# Patient Record
Sex: Male | Born: 1995 | Hispanic: Yes | Marital: Single | Smoking: Never smoker
Health system: Southern US, Community
[De-identification: ages and names within clinical notes are randomized; demographics above are authoritative.]

---

## 2020-12-18 ENCOUNTER — Other Ambulatory Visit (HOSPITAL_COMMUNITY): Payer: Self-pay

## 2021-01-05 ENCOUNTER — Encounter: Payer: Self-pay | Admitting: Nurse Practitioner

## 2021-01-05 ENCOUNTER — Ambulatory Visit: Payer: BLUE CROSS/BLUE SHIELD | Admitting: Nurse Practitioner

## 2021-01-05 ENCOUNTER — Other Ambulatory Visit: Payer: Self-pay

## 2021-01-05 VITALS — BP 118/78 | HR 99 | Temp 98.4°F | Ht 64.6 in | Wt 151.6 lb

## 2021-01-05 DIAGNOSIS — F1291 Cannabis use, unspecified, in remission: Secondary | ICD-10-CM

## 2021-01-05 DIAGNOSIS — Z87898 Personal history of other specified conditions: Secondary | ICD-10-CM | POA: Diagnosis not present

## 2021-01-05 DIAGNOSIS — Z7689 Persons encountering health services in other specified circumstances: Secondary | ICD-10-CM

## 2021-01-05 NOTE — Patient Instructions (Signed)
Managing the Challenge of Quitting Smoking Quitting smoking is a physical and mental challenge. You will face cravings, withdrawal symptoms, and temptation. Before quitting, work with your health care provider to make a plan that can help you manage quitting. Preparation can help you quit and keep you from giving in. How to manage lifestyle changes Managing stress Stress can make you want to smoke, and wanting to smoke may cause stress. It is important to find ways to manage your stress. You might try some of the following:  Practice relaxation techniques. ? Breathe slowly and deeply, in through your nose and out through your mouth. ? Listen to music. ? Soak in a bath or take a shower. ? Imagine a peaceful place or vacation.  Get some support. ? Talk with family or friends about your stress. ? Join a support group. ? Talk with a counselor or therapist.  Get some physical activity. ? Go for a walk, run, or bike ride. ? Play a favorite sport. ? Practice yoga.   Medicines Talk with your health care provider about medicines that might help you deal with cravings and make quitting easier for you. Relationships Social situations can be difficult when you are quitting smoking. To manage this, you can:  Avoid parties and other social situations where people might be smoking.  Avoid alcohol.  Leave right away if you have the urge to smoke.  Explain to your family and friends that you are quitting smoking. Ask for support and let them know you might be a bit grumpy.  Plan activities where smoking is not an option. General instructions Be aware that many people gain weight after they quit smoking. However, not everyone does. To keep from gaining weight, have a plan in place before you quit and stick to the plan after you quit. Your plan should include:  Having healthy snacks. When you have a craving, it may help to: ? Eat popcorn, carrots, celery, or other cut vegetables. ? Chew  sugar-free gum.  Changing how you eat. ? Eat small portion sizes at meals. ? Eat 4-6 small meals throughout the day instead of 1-2 large meals a day. ? Be mindful when you eat. Do not watch television or do other things that might distract you as you eat.  Exercising regularly. ? Make time to exercise each day. If you do not have time for a long workout, do short bouts of exercise for 5-10 minutes several times a day. ? Do some form of strengthening exercise, such as weight lifting. ? Do some exercise that gets your heart beating and causes you to breathe deeply, such as walking fast, running, swimming, or biking. This is very important.  Drinking plenty of water or other low-calorie or no-calorie drinks. Drink 6-8 glasses of water daily.   How to recognize withdrawal symptoms Your body and mind may experience discomfort as you try to get used to not having nicotine in your system. These effects are called withdrawal symptoms. They may include:  Feeling hungrier than normal.  Having trouble concentrating.  Feeling irritable or restless.  Having trouble sleeping.  Feeling depressed.  Craving a cigarette. To manage withdrawal symptoms:  Avoid places, people, and activities that trigger your cravings.  Remember why you want to quit.  Get plenty of sleep.  Avoid coffee and other caffeinated drinks. These may worsen some of your symptoms. These symptoms may surprise you. But be assured that they are normal to have when quitting smoking. How to manage cravings   Come up with a plan for how to deal with your cravings. The plan should include the following:  A definition of the specific situation you want to deal with.  An alternative action you will take.  A clear idea for how this action will help.  The name of someone who might help you with this. Cravings usually last for 5-10 minutes. Consider taking the following actions to help you with your plan to deal with  cravings:  Keep your mouth busy. ? Chew sugar-free gum. ? Suck on hard candies or a straw. ? Brush your teeth.  Keep your hands and body busy. ? Change to a different activity right away. ? Squeeze or play with a ball. ? Do an activity or a hobby, such as making bead jewelry, practicing needlepoint, or working with wood. ? Mix up your normal routine. ? Take a short exercise break. Go for a quick walk or run up and down stairs.  Focus on doing something kind or helpful for someone else.  Call a friend or family member to talk during a craving.  Join a support group.  Contact a quitline. Where to find support To get help or find a support group:  Call the National Cancer Institute's Smoking Quitline: 1-800-QUIT NOW (784-8669)  Visit the website of the Substance Abuse and Mental Health Services Administration: www.samhsa.gov  Text QUIT to SmokefreeTXT: 478848 Where to find more information Visit these websites to find more information on quitting smoking:  National Cancer Institute: www.smokefree.gov  American Lung Association: www.lung.org  American Cancer Society: www.cancer.org  Centers for Disease Control and Prevention: www.cdc.gov  American Heart Association: www.heart.org Contact a health care provider if:  You want to change your plan for quitting.  The medicines you are taking are not helping.  Your eating feels out of control or you cannot sleep. Get help right away if:  You feel depressed or become very anxious. Summary  Quitting smoking is a physical and mental challenge. You will face cravings, withdrawal symptoms, and temptation to smoke again. Preparation can help you as you go through these challenges.  Try different techniques to manage stress, handle social situations, and prevent weight gain.  You can deal with cravings by keeping your mouth busy (such as by chewing gum), keeping your hands and body busy, calling family or friends, or  contacting a quitline for people who want to quit smoking.  You can deal with withdrawal symptoms by avoiding places where people smoke, getting plenty of rest, and avoiding drinks with caffeine. This information is not intended to replace advice given to you by your health care provider. Make sure you discuss any questions you have with your health care provider. Document Revised: 05/28/2019 Document Reviewed: 05/28/2019 Elsevier Patient Education  2021 Elsevier Inc.  

## 2021-01-05 NOTE — Progress Notes (Signed)
I,Tianna Badgett,acting as a Neurosurgeon for [employer], NP.,have documented all relevant documentation on the behalf of [employer], NP,as directed by  Charlesetta Ivory, NP while in the presence of Charlesetta Ivory, NP.  This visit occurred during the SARS-CoV-2 public health emergency.  Safety protocols were in place, including screening questions prior to the visit, additional usage of staff PPE, and extensive cleaning of exam room while observing appropriate contact time as indicated for disinfecting solutions.  Subjective:     Patient ID: Hector Hunt , male    DOB: 1996/01/18 , 25 y.o.   MRN: [id number]   Chief Complaint  Patient presents with  . Establish Care    HPI  Patient is here to establish care. He has not been followed by PCP since pediatrics. He started smoking when he was 25 years old. And he quit last month in April of 2022.  He has been doing it for many years but states this time he is for sure. He smoked less than a gram a day. No other drugs. He only smoked marajuana.  Alcohol: Yes. Twice a week. He drink beer and wine. He drinks less than 3 beers. He is concerned about his lungs. He does not have any shortness of breath, no pain, no pressure. He feels like his lungs are inflamed. He feels pressure and bruised on his lungs.  Diet: Does not eat a lot of fast food. Chicken and [place] mostly, corn, broccoli, cucumbers, brown rice. Water Exercise: 5 x week, skateboard.  Not in school. 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     No past medical history on file.   Family History  Problem Relation Age of Onset  . Healthy Mother   . Healthy Father   . HIV Brother     No current outpatient medications on file.   No Known Allergies   Review of Systems  Constitutional: Negative.  Negative for chills, fatigue and fever.  HENT: Negative for sinus pain and sneezing.   Respiratory: Negative.  Negative for cough, chest tightness, shortness of  breath and wheezing.   Cardiovascular: Negative.  Negative for chest pain and palpitations.  Gastrointestinal: Negative.  Negative for diarrhea and nausea.  Neurological: Negative.      Today's Vitals   01/05/21 1444  BP: 118/78  Pulse: 99  Temp: 98.4 F (36.9 C)  TempSrc: Oral  Weight: 151 lb 9.6 oz (68.8 kg)  Height: 5' 4.6" (1.641 m)   Body mass index is 25.54 kg/m.   Objective:  Physical Exam Constitutional:      Appearance: Normal appearance.  HENT:     Head: Normocephalic and atraumatic.  Cardiovascular:     Rate and Rhythm: Normal rate and regular rhythm.     Pulses: Normal pulses.     Heart sounds: Normal heart sounds.  Pulmonary:     Effort: Pulmonary effort is normal. No respiratory distress.     Breath sounds: Normal breath sounds. No wheezing.  Chest:     Chest wall: No tenderness.  Skin:    Capillary Refill: Capillary refill takes less than 2 seconds.  Neurological:     Mental Status: He is alert.  Psychiatric:        Mood and Affect: Mood normal.        Behavior: Behavior normal.         Assessment And Plan:     1. Establishing care with new doctor, encounter for -Patient is here to establish care. Hector Hunt  over patient medical, family, social and surgical history. -Reviewed with patient their medications and any allergies  -Reviewed with patient their sexual orientation, drug/tobacco and alcohol use -Dicussed any new concerns with patient  -recommended patient comes in for a physical exam and complete blood work.  -Educated patient about the importance of annual screenings and immunizations.  -Advised patient to eat a healthy diet along with exercise for atleast 30-45 min atleast 4-5 days of the week.   2. History of marijuana use -will get a chest xray due to history of marijuana use since he was 25 years old. He is concerned about his lungs and health. Lungs sounds were clear upon auscultation.  - DG Chest 2 View; Future   Follow up: 1 month  for annual physical exam   The patient was encouraged to call or send a message through MyChart for any questions or concerns.   Patient was given opportunity to ask questions. Patient verbalized understanding of the plan and was able to repeat key elements of the plan. All questions were answered to their satisfaction.  Raman Bonner Larue, DNP   I, Raman Tyrick Dunagan have reviewed all documentation for this visit. The documentation on 01/05/21 for the exam, diagnosis, procedures, and orders are all accurate and complete.    IF YOU HAVE BEEN REFERRED TO A SPECIALIST, IT MAY TAKE 1-2 WEEKS TO SCHEDULE/PROCESS THE REFERRAL. IF YOU HAVE NOT HEARD FROM US/SPECIALIST IN TWO WEEKS, PLEASE GIVE [place] A CALL AT [phone] X [phone].   THE PATIENT IS ENCOURAGED TO PRACTICE SOCIAL DISTANCING DUE TO THE COVID-19 PANDEMIC.

## 2021-02-03 ENCOUNTER — Other Ambulatory Visit (HOSPITAL_COMMUNITY)
Admission: RE | Admit: 2021-02-03 | Discharge: 2021-02-03 | Disposition: A | Discharge status: Home / Self Care | Payer: BLUE CROSS/BLUE SHIELD | Source: Ambulatory Visit | Attending: Nurse Practitioner | Admitting: Nurse Practitioner

## 2021-02-03 ENCOUNTER — Ambulatory Visit
Admission: RE | Admit: 2021-02-03 | Discharge: 2021-02-03 | Disposition: A | Discharge status: Home / Self Care | Payer: Self-pay | Source: Ambulatory Visit | Attending: Nurse Practitioner | Admitting: Nurse Practitioner

## 2021-02-03 ENCOUNTER — Encounter: Payer: Self-pay | Admitting: Nurse Practitioner

## 2021-02-03 ENCOUNTER — Ambulatory Visit (INDEPENDENT_AMBULATORY_CARE_PROVIDER_SITE_OTHER): Payer: BLUE CROSS/BLUE SHIELD | Admitting: Nurse Practitioner

## 2021-02-03 ENCOUNTER — Other Ambulatory Visit: Payer: Self-pay

## 2021-02-03 VITALS — BP 110/60 | HR 73 | Temp 98.2°F | Ht 64.8 in | Wt 156.2 lb

## 2021-02-03 DIAGNOSIS — F1291 Cannabis use, unspecified, in remission: Secondary | ICD-10-CM

## 2021-02-03 DIAGNOSIS — Z87898 Personal history of other specified conditions: Secondary | ICD-10-CM | POA: Diagnosis not present

## 2021-02-03 DIAGNOSIS — Z23 Encounter for immunization: Secondary | ICD-10-CM

## 2021-02-03 DIAGNOSIS — Z118 Encounter for screening for other infectious and parasitic diseases: Secondary | ICD-10-CM | POA: Diagnosis not present

## 2021-02-03 DIAGNOSIS — Z0000 Encounter for general adult medical examination without abnormal findings: Secondary | ICD-10-CM | POA: Diagnosis not present

## 2021-02-03 NOTE — Progress Notes (Signed)
I,Tianna Badgett,acting as a Education administrator for [employer], NP.,have documented all relevant documentation on the behalf of [employer], NP,as directed by  Bary Castilla, NP while in the presence of Bary Castilla, NP.  This visit occurred during the SARS-CoV-2 public health emergency.  Safety protocols were in place, including screening questions prior to the visit, additional usage of staff PPE, and extensive cleaning of exam room while observing appropriate contact time as indicated for disinfecting solutions.  Subjective:     Patient ID: Hector Hunt , male    DOB: [birth year] , 25 y.o.   MRN: [id number]   Chief Complaint  Patient presents with   Annual Exam    HPI  Patient is here for physical exam. No concerns today  Not working not in school.  Diet: mixed. Doesn't eat a lot of fast food. He doesn't drink sodas. A lot of water and apple juice  Exercise: He goes to the gym and skateboards.  Drink: occasionally  Smoke: No (hx of mariajuana for x 8 years)  Sexually active: yes  Eye doctor: yearly  Dentist: he will go every six months     No past medical history on file.   Family History  Problem Relation Age of Onset   Healthy Mother    Healthy Father    HIV Brother     No current outpatient medications on file.   No Known Allergies   Men's preventive visit. Patient Health Questionnaire (PHQ-2) is  [place] Office Visit from 01/05/2021 in [facility]  PHQ-2 Total Score 0     . Patient is on a healthy diet. Marital status: Single. Relevant history for alcohol use is:  Social History   Substance and Sexual Activity  Alcohol Use Yes   Comment: social   . Relevant history for tobacco use is: history of marijuana use  Social History   Tobacco Use  Smoking Status Never  Smokeless Tobacco Never  .   Review of Systems  Constitutional: Negative.  Negative for chills, fatigue and fever.  HENT: Negative.  Negative for  congestion, rhinorrhea, sinus pressure and sinus pain.   Eyes: Negative.   Respiratory:  Positive for chest tightness. Negative for cough, shortness of breath and wheezing.   Cardiovascular: Negative.  Negative for chest pain and palpitations.  Gastrointestinal: Negative.  Negative for constipation and diarrhea.  Endocrine: Negative.   Genitourinary: Negative.   Musculoskeletal: Negative.  Negative for arthralgias and myalgias.  Skin: Negative.   Allergic/Immunologic: Negative.   Neurological: Negative.  Negative for dizziness and headaches.  Hematological: Negative.   Psychiatric/Behavioral: Negative.      Today's Vitals   02/03/21 1432  BP: 110/60  Pulse: 73  Temp: 98.2 F (36.8 C)  TempSrc: Oral  Weight: 156 lb 3.2 oz (70.9 kg)  Height: 5' 4.8" (1.646 m)   Body mass index is 26.15 kg/m.  Wt Readings from Last 3 Encounters:  02/03/21 156 lb 3.2 oz (70.9 kg)  01/05/21 151 lb 9.6 oz (68.8 kg)    Objective:  Physical Exam Vitals and nursing note reviewed.  Constitutional:      Appearance: Normal appearance.  HENT:     Head: Normocephalic and atraumatic.     Right Ear: Tympanic membrane, ear canal and external ear normal.     Left Ear: Tympanic membrane, ear canal and external ear normal.     Nose: Nose normal. No congestion.     Mouth/Throat:     Mouth: Mucous membranes are  moist.     Pharynx: Oropharynx is clear.  Eyes:     Extraocular Movements: Extraocular movements intact.     Conjunctiva/sclera: Conjunctivae normal.     Pupils: Pupils are equal, round, and reactive to light.  Cardiovascular:     Rate and Rhythm: Normal rate and regular rhythm.     Pulses: Normal pulses.     Heart sounds: Normal heart sounds.  Pulmonary:     Effort: Pulmonary effort is normal. No respiratory distress.     Breath sounds: Normal breath sounds. No wheezing.  Chest:  Breasts:    Right: Normal. No swelling, bleeding, inverted nipple, mass or nipple discharge.     Left: Normal.  No swelling, bleeding, inverted nipple, mass or nipple discharge.  Abdominal:     General: Abdomen is flat. Bowel sounds are normal.     Palpations: Abdomen is soft.  Genitourinary:    Prostate: Normal.     Rectum: Normal. Guaiac result negative.  Musculoskeletal:        General: No swelling. Normal range of motion.     Cervical back: Normal range of motion and neck supple.  Skin:    General: Skin is warm and dry.     Capillary Refill: Capillary refill takes less than 2 seconds.  Neurological:     General: No focal deficit present.     Mental Status: He is alert and oriented to person, place, and time.  Psychiatric:        Mood and Affect: Mood normal.        Behavior: Behavior normal.        Assessment And Plan:    1. Encounter for annual physical exam --Patient is here for their annual physical exam and we discussed any changes to medication and medical history.  -Behavior modification was discussed as well as diet and exercise history  -Patient will continue to exercise regularly and modify their diet.  -Recommendation for yearly physical annuals, immunization and screenings including mammogram and colonoscopy were discussed with the patient.  -Recommended intake of multivitamin, vitamin D and calcium.  -Individualized advise was given to the patient pertaining to their own health history in regards to diet, exercise, medical condition and referrals.  - CBC - Hemoglobin A1c - CMP14+EGFR - Lipid panel - Hepatitis C antibody - HIV Antibody (routine testing w rflx)  2. Screening for chlamydial disease -Will check urine for chlamydial dx  - Urine cytology ancillary only  3. History of marijuana use -patient has a hx of using marijuana x 8 years. He no longer uses it but feels chest tightness at times.  -Chest xray in place from prior visit.   4. Need for Tdap vaccination - Tdap vaccine greater than or equal to 7yo IM  The patient was encouraged to call or send a  message through [place] for any questions or concerns.   Follow up: annual exam in 1 year   Staying healthy and adopting a healthy lifestyle for your overall health is important. You should eat 7 or more servings of fruits and vegetables per day. You should drink plenty of water to keep yourself hydrated and your kidneys healthy. This includes about 65-80+ fluid ounces of water. Limit your intake of animal fats especially for elevated cholesterol. Avoid highly processed food and limit your salt intake if you have hypertension. Avoid foods high in saturated/Trans fats. Along with a healthy diet it is also very important to maintain time for yourself to maintain a healthy mental  health with low stress levels. You should get atleast 150 min of moderate intensity exercise weekly for a healthy heart. Along with eating right and exercising, aim for at least 7-9 hours of sleep daily.  Eat more whole grains which includes barley, wheat berries, oats, brown rice and whole wheat pasta. Use healthy plant oils which include olive, soy, corn, sunflower and peanut. Limit your caffeine and sugary drinks. Limit your intake of fast foods. Limit milk and dairy products to one or two daily servings.    Patient was given opportunity to ask questions. Patient verbalized understanding of the plan and was able to repeat key elements of the plan. All questions were answered to their satisfaction.  Raman Shamona Wirtz, DNP   I, Raman Epimenio Schetter have reviewed all documentation for this visit. The documentation on 02/03/21 for the exam, diagnosis, procedures, and orders are all accurate and complete.     THE PATIENT IS ENCOURAGED TO PRACTICE SOCIAL DISTANCING DUE TO THE COVID-19 PANDEMIC.

## 2021-02-03 NOTE — Patient Instructions (Signed)
Health Maintenance, Male Adopting a healthy lifestyle and getting preventive care are important in promoting health and wellness. Ask your health care provider about: The right schedule for you to have regular tests and exams. Things you can do on your own to prevent diseases and keep yourself healthy. What should I know about diet, weight, and exercise? Eat a healthy diet  Eat a diet that includes plenty of vegetables, fruits, low-fat dairy products, and lean protein. Do not eat a lot of foods that are high in solid fats, added sugars, or sodium.  Maintain a healthy weight Body mass index (BMI) is a measurement that can be used to identify possible weight problems. It estimates body fat based on height and weight. Your health care provider can help determine your BMI and help you achieve or maintain ahealthy weight. Get regular exercise Get regular exercise. This is one of the most important things you can do for your health. Most adults should: Exercise for at least 150 minutes each week. The exercise should increase your heart rate and make you sweat (moderate-intensity exercise). Do strengthening exercises at least twice a week. This is in addition to the moderate-intensity exercise. Spend less time sitting. Even light physical activity can be beneficial. Watch cholesterol and blood lipids Have your blood tested for lipids and cholesterol at 23 years of age, then havethis test every 5 years. You may need to have your cholesterol levels checked more often if: Your lipid or cholesterol levels are high. You are older than 23 years of age. You are at high risk for heart disease. What should I know about cancer screening? Many types of cancers can be detected early and may often be prevented. Depending on your health history and family history, you may need to have cancer screening at various ages. This may include screening for: Colorectal cancer. Prostate cancer. Skin cancer. Lung  cancer. What should I know about heart disease, diabetes, and high blood pressure? Blood pressure and heart disease High blood pressure causes heart disease and increases the risk of stroke. This is more likely to develop in people who have high blood pressure readings, are of African descent, or are overweight. Talk with your health care provider about your target blood pressure readings. Have your blood pressure checked: Every 3-5 years if you are 18-39 years of age. Every year if you are 40 years old or older. If you are between the ages of 65 and 75 and are a current or former smoker, ask your health care provider if you should have a one-time screening for abdominal aortic aneurysm (AAA). Diabetes Have regular diabetes screenings. This checks your fasting blood sugar level. Have the screening done: Once every three years after age 45 if you are at a normal weight and have a low risk for diabetes. More often and at a younger age if you are overweight or have a high risk for diabetes. What should I know about preventing infection? Hepatitis B If you have a higher risk for hepatitis B, you should be screened for this virus. Talk with your health care provider to find out if you are at risk forhepatitis B infection. Hepatitis C Blood testing is recommended for: Everyone born from 1945 through 1965. Anyone with known risk factors for hepatitis C. Sexually transmitted infections (STIs) You should be screened each year for STIs, including gonorrhea and chlamydia, if: You are sexually active and are younger than 24 years of age. You are older than 24 years of age   and your health care provider tells you that you are at risk for this type of infection. Your sexual activity has changed since you were last screened, and you are at increased risk for chlamydia or gonorrhea. Ask your health care provider if you are at risk. Ask your health care provider about whether you are at high risk for HIV.  Your health care provider may recommend a prescription medicine to help prevent HIV infection. If you choose to take medicine to prevent HIV, you should first get tested for HIV. You should then be tested every 3 months for as long as you are taking the medicine. Follow these instructions at home: Lifestyle Do not use any products that contain nicotine or tobacco, such as cigarettes, e-cigarettes, and chewing tobacco. If you need help quitting, ask your health care provider. Do not use street drugs. Do not share needles. Ask your health care provider for help if you need support or information about quitting drugs. Alcohol use Do not drink alcohol if your health care provider tells you not to drink. If you drink alcohol: Limit how much you have to 0-2 drinks a day. Be aware of how much alcohol is in your drink. In the [place] one drink equals one 12 oz bottle of beer (355 mL), one 5 oz glass of wine (148 mL), or one 1 oz glass of hard liquor (44 mL). General instructions Schedule regular health, dental, and eye exams. Stay current with your vaccines. Tell your health care provider if: You often feel depressed. You have ever been abused or do not feel safe at home. Summary Adopting a healthy lifestyle and getting preventive care are important in promoting health and wellness. Follow your health care provider's instructions about healthy diet, exercising, and getting tested or screened for diseases. Follow your health care provider's instructions on monitoring your cholesterol and blood pressure. This information is not intended to replace advice given to you by your health care provider. Make sure you discuss any questions you have with your healthcare provider. Document Revised: 08/01/2018 Document Reviewed: 08/01/2018 Elsevier Patient Education  2022 Elsevier Inc.  

## 2021-02-04 LAB — CMP14+EGFR
ALT: 128 IU/L — ABNORMAL HIGH (ref 0–44)
AST: 64 IU/L — ABNORMAL HIGH (ref 0–40)
Albumin/Globulin Ratio: 2.3 — ABNORMAL HIGH (ref 1.2–2.2)
Albumin: 5.3 g/dL — ABNORMAL HIGH (ref 4.1–5.2)
Alkaline Phosphatase: 88 IU/L (ref 44–121)
BUN/Creatinine Ratio: 9 (ref 9–20)
BUN: 9 mg/dL (ref 6–20)
Bilirubin Total: 0.6 mg/dL (ref 0.0–1.2)
CO2: 25 mmol/L (ref 20–29)
Calcium: 9.8 mg/dL (ref 8.7–10.2)
Chloride: 99 mmol/L (ref 96–106)
Creatinine, Ser: 0.95 mg/dL (ref 0.76–1.27)
Globulin, Total: 2.3 g/dL (ref 1.5–4.5)
Glucose: 87 mg/dL (ref 65–99)
Potassium: 4.8 mmol/L (ref 3.5–5.2)
Sodium: 139 mmol/L (ref 134–144)
Total Protein: 7.6 g/dL (ref 6.0–8.5)
eGFR: 115 mL/min/{1.73_m2} (ref >59)

## 2021-02-04 LAB — LIPID PANEL
Chol/HDL Ratio: 6.2 ratio — ABNORMAL HIGH (ref 0.0–5.0)
Cholesterol, Total: 267 mg/dL — ABNORMAL HIGH (ref 100–199)
HDL: 43 mg/dL (ref >39)
LDL Chol Calc (NIH): 206 mg/dL — ABNORMAL HIGH (ref 0–99)
Triglycerides: 102 mg/dL (ref 0–149)
VLDL Cholesterol Cal: 18 mg/dL (ref 5–40)

## 2021-02-04 LAB — HEMOGLOBIN A1C
Est. average glucose Bld gHb Est-mCnc: 103 mg/dL
Hgb A1c MFr Bld: 5.2 % (ref 4.8–5.6)

## 2021-02-04 LAB — HIV ANTIBODY (ROUTINE TESTING W REFLEX): HIV Screen 4th Generation wRfx: NONREACTIVE

## 2021-02-04 LAB — HEPATITIS C ANTIBODY: Hep C Virus Ab: <0.1 s/co ratio (ref 0.0–0.9)

## 2021-02-04 LAB — CBC
Hematocrit: 44 % (ref 37.5–51.0)
Hemoglobin: 15 g/dL (ref 13.0–17.7)
MCH: 30.8 pg (ref 26.6–33.0)
MCHC: 34.1 g/dL (ref 31.5–35.7)
MCV: 90 fL (ref 79–97)
Platelets: 249 10*3/uL (ref 150–450)
RBC: 4.87 x10E6/uL (ref 4.14–5.80)
RDW: 12.4 % (ref 11.6–15.4)
WBC: 4.2 10*3/uL (ref 3.4–10.8)

## 2021-02-05 LAB — URINE CYTOLOGY ANCILLARY ONLY
Chlamydia: NEGATIVE
Comment: NEGATIVE
Comment: NORMAL
Neisseria Gonorrhea: NEGATIVE

## 2021-03-23 ENCOUNTER — Other Ambulatory Visit: Payer: Self-pay | Admitting: Nurse Practitioner

## 2022-02-09 ENCOUNTER — Encounter: Payer: BLUE CROSS/BLUE SHIELD | Admitting: Nurse Practitioner

## 2022-07-11 IMAGING — CR DG CHEST 2V
2 series · 2 of 2 positions shown · non-contrast
Comparison: None.

CLINICAL DATA: Former marijuana use are, patient concern for "chest
inflammation", abnormal sensation with deep inspiration.

EXAM:
CHEST - 2 VIEW

[w chest pa]
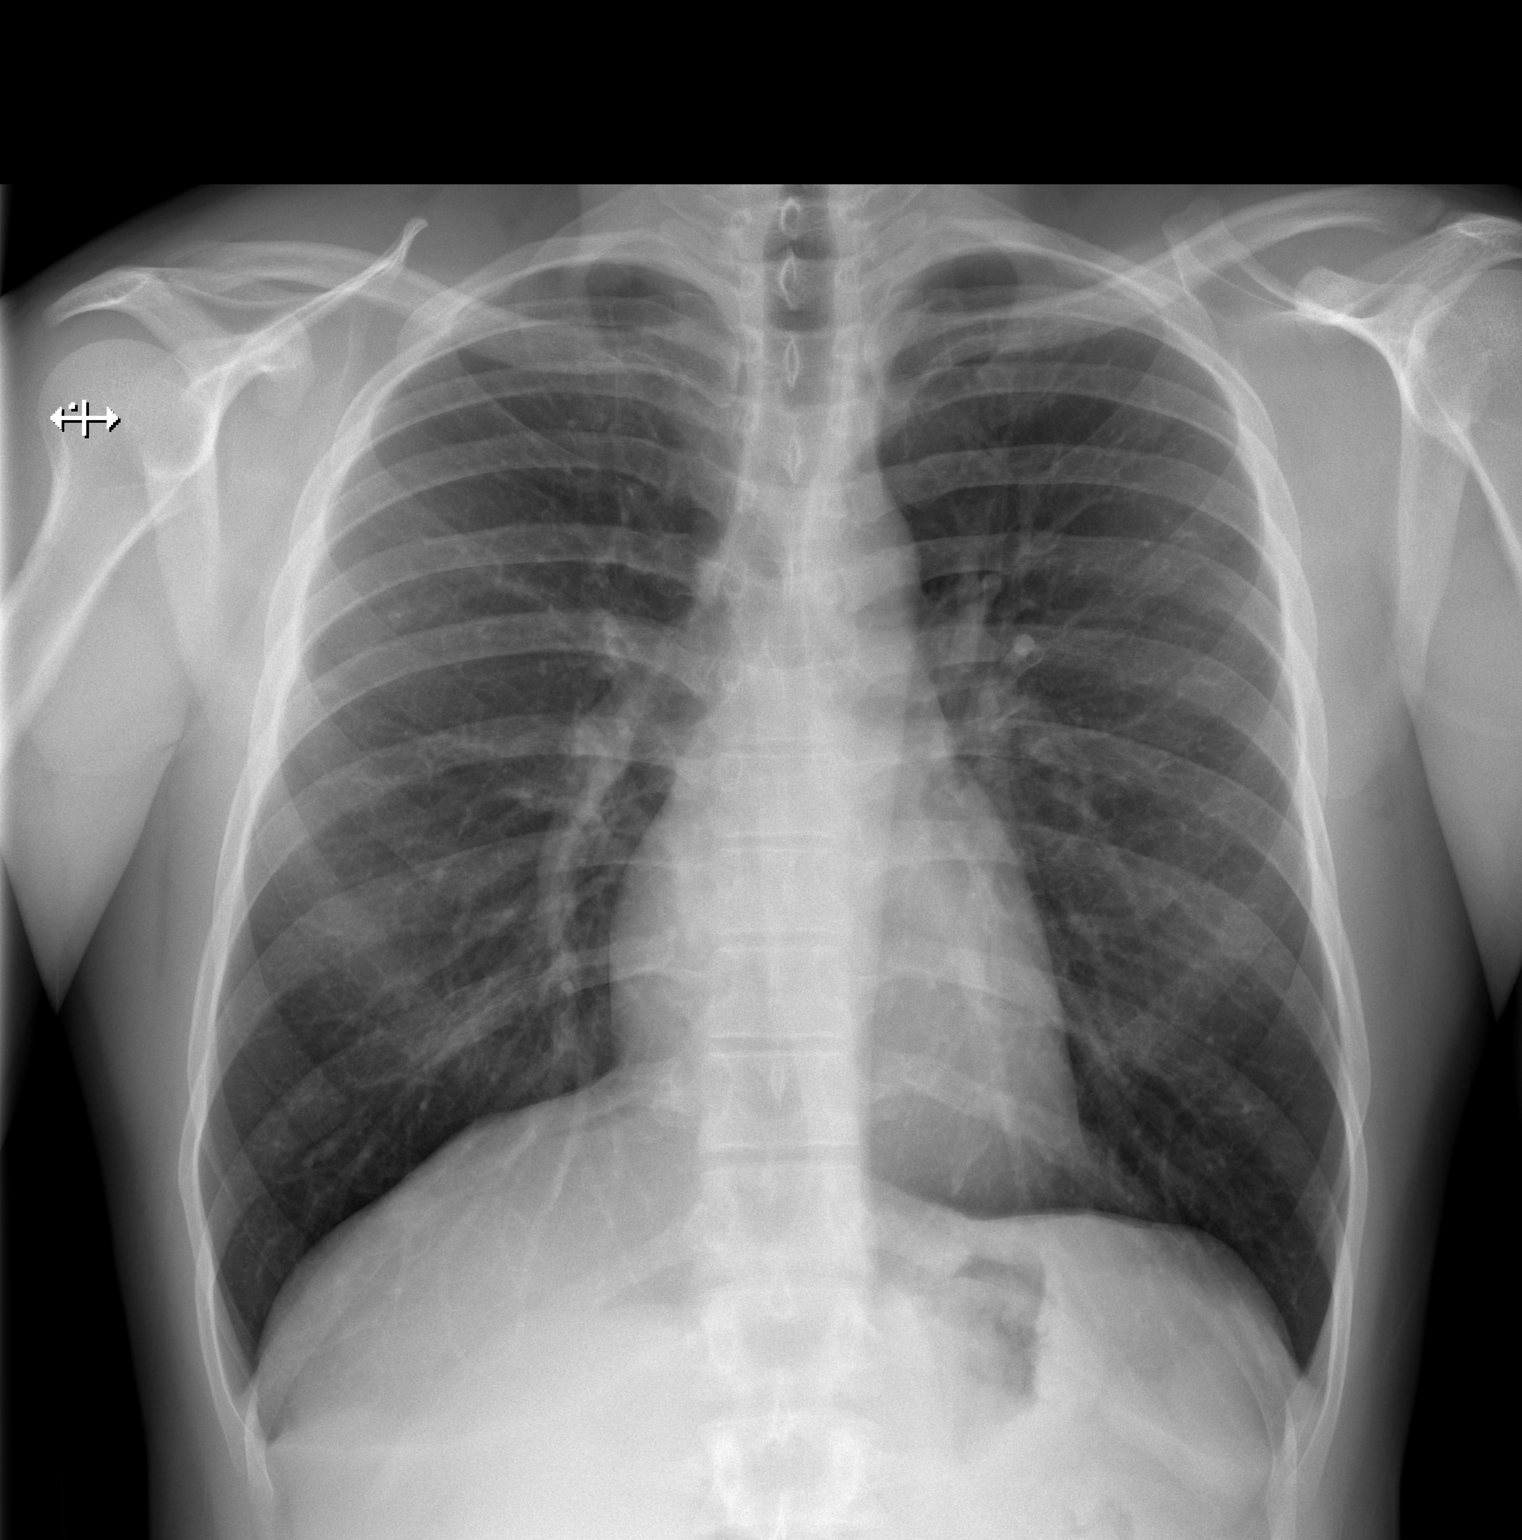

[w chest lat]
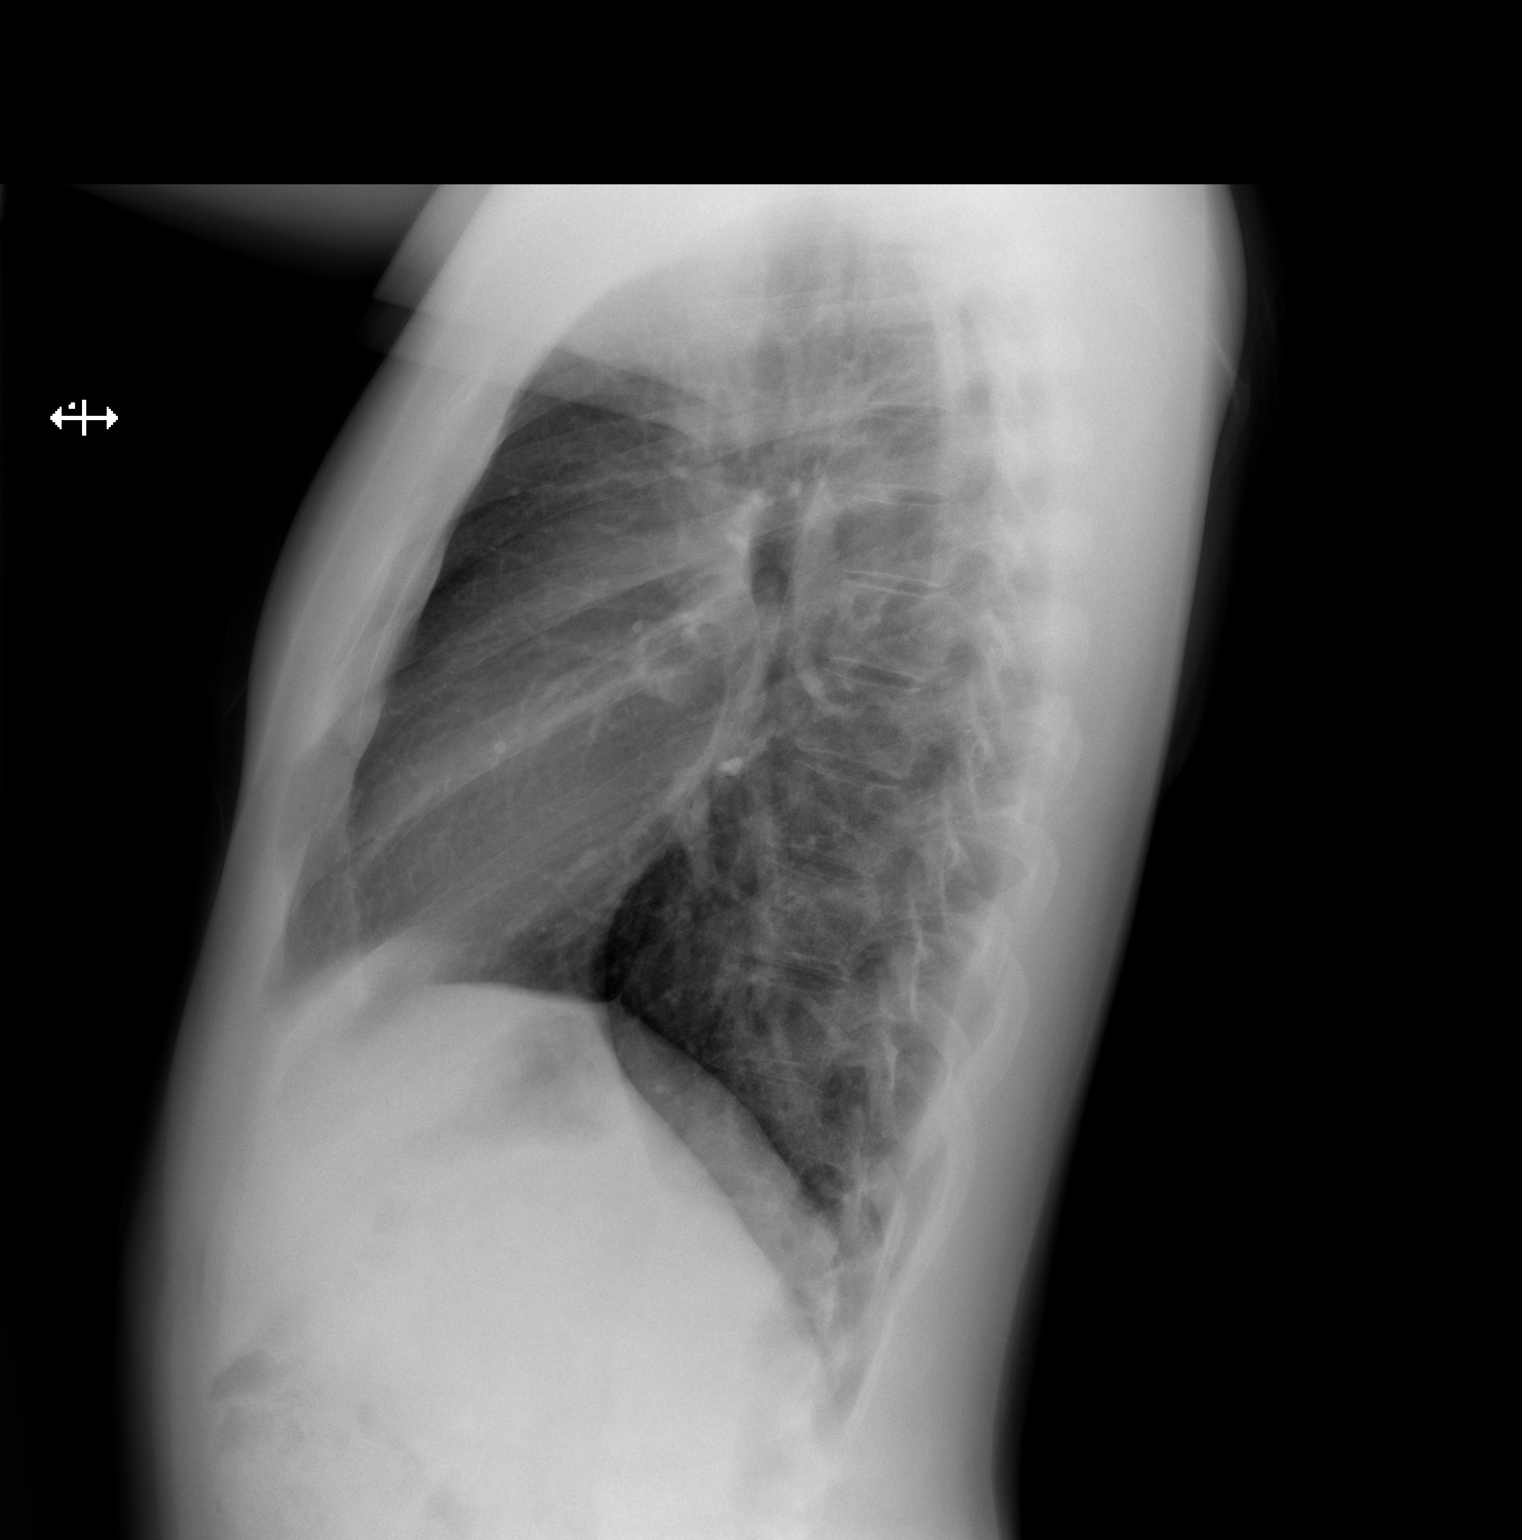

[2 of 2 positions shown; findings below may reference images not displayed]

FINDINGS: No consolidation, features of edema, pneumothorax, or effusion.
Pulmonary vascularity is normally distributed. The cardiomediastinal
contours are unremarkable. No acute osseous or soft tissue
abnormality.
IMPRESSION: No acute cardiopulmonary abnormality.
